# Patient Record
Sex: Female | Born: 2000 | Race: White | Hispanic: No | Marital: Single | Smoking: Never smoker
Health system: Southern US, Community
[De-identification: ages and names within clinical notes are randomized; demographics above are authoritative.]

## PROBLEM LIST (undated history)

## (undated) DIAGNOSIS — K5900 Constipation, unspecified: Secondary | ICD-10-CM

## (undated) DIAGNOSIS — L309 Dermatitis, unspecified: Secondary | ICD-10-CM

## (undated) HISTORY — PX: WISDOM TOOTH EXTRACTION: SHX21

## (undated) HISTORY — PX: LAPAROSCOPIC OOPHERECTOMY: SHX6507

---
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
## 2018-11-23 ENCOUNTER — Emergency Department (HOSPITAL_BASED_OUTPATIENT_CLINIC_OR_DEPARTMENT_OTHER): Payer: BC Managed Care – PPO

## 2018-11-23 ENCOUNTER — Encounter (HOSPITAL_BASED_OUTPATIENT_CLINIC_OR_DEPARTMENT_OTHER): Payer: Self-pay | Admitting: Emergency Medicine

## 2018-11-23 ENCOUNTER — Other Ambulatory Visit: Payer: Self-pay

## 2018-11-23 ENCOUNTER — Emergency Department (HOSPITAL_BASED_OUTPATIENT_CLINIC_OR_DEPARTMENT_OTHER)
Admission: EM | Admit: 2018-11-23 | Discharge: 2018-11-23 | Disposition: A | Discharge status: Home / Self Care | Payer: BC Managed Care – PPO | Attending: Emergency Medicine | Admitting: Emergency Medicine

## 2018-11-23 DIAGNOSIS — S93492A Sprain of other ligament of left ankle, initial encounter: Secondary | ICD-10-CM | POA: Insufficient documentation

## 2018-11-23 DIAGNOSIS — Y999 Unspecified external cause status: Secondary | ICD-10-CM | POA: Insufficient documentation

## 2018-11-23 DIAGNOSIS — W108XXA Fall (on) (from) other stairs and steps, initial encounter: Secondary | ICD-10-CM | POA: Diagnosis not present

## 2018-11-23 DIAGNOSIS — Y9301 Activity, walking, marching and hiking: Secondary | ICD-10-CM | POA: Insufficient documentation

## 2018-11-23 DIAGNOSIS — S99912A Unspecified injury of left ankle, initial encounter: Secondary | ICD-10-CM | POA: Diagnosis present

## 2018-11-23 DIAGNOSIS — Y929 Unspecified place or not applicable: Secondary | ICD-10-CM | POA: Diagnosis not present

## 2018-11-23 MED ORDER — IBUPROFEN 400 MG PO TABS
400.0000 mg | ORAL_TABLET | Freq: Once | ORAL | Status: AC
  Administered 2018-11-23: 400 mg via ORAL
  Filled 2018-11-23: qty 1

## 2018-11-23 MED ORDER — ACETAMINOPHEN 500 MG PO TABS
1000.0000 mg | ORAL_TABLET | Freq: Once | ORAL | Status: AC
  Administered 2018-11-23: 1000 mg via ORAL
  Filled 2018-11-23: qty 2

## 2018-11-23 NOTE — ED Provider Notes (Signed)
[facility] EMERGENCY DEPARTMENT Provider Note   CSN: [id number] Arrival date & time: 11/23/18  1728     History   Chief Complaint Chief Complaint  Patient presents with  . Ankle Pain    HPI Renee NielsenVictoria Foley is a 18 y.o. female.     HPI   18 year old female presents with concern for left ankle pain after a fall.  Reports that she fell down 1 stair, with immediate pain to her left ankle.  Reports she has not been able to walk on it since this happened.  Denies any other injuries from the fall, including no knee pain, no neck pain, headache, or other injuries.  Denies numbness or weakness.  Past Medical History:  Diagnosis Date  . Constipated     There are no active problems to display for this patient.   Past Surgical History:  Procedure Laterality Date  . LAPAROSCOPIC OOPHERECTOMY       OB History   No obstetric history on file.      Home Medications    Prior to Admission medications   Medication Sig Start Date End Date Taking? Authorizing Provider  drospirenone-ethinyl estradiol (YAZ) 3-0.02 MG tablet Take 1 tablet by mouth daily.   Yes [provider]  EPINEPHrine 0.3 mg/0.3 mL IJ SOAJ injection Inject 0.3 mLs (0.3 mg total) into the muscle once. 02/01/14   Rolland PorterJames, Mark, MD    Family History No family history on file.  Social History Social History   Tobacco Use  . Smoking status: Never Smoker  . Smokeless tobacco: Never Used  Substance Use Topics  . Alcohol use: No  . Drug use: Not on file     Allergies   Amoxil [amoxicillin]   Review of Systems Review of Systems  Constitutional: Negative for fever.  Respiratory: Negative for cough.   Cardiovascular: Negative for chest pain.  Gastrointestinal: Negative for abdominal pain.  Musculoskeletal: Positive for arthralgias. Negative for back pain and neck pain.  Skin: Negative for rash.  Neurological: Negative for syncope, weakness, numbness and headaches.     Physical Exam  Updated Vital Signs BP (!) 128/91 (BP Location: Right Arm)   Pulse (!) 109   Temp 98.2 F (36.8 C) (Oral)   Resp 18   Ht 5' (1.524 m)   Wt 68 kg   LMP 11/23/2018   SpO2 100%   BMI 29.29 kg/m   Physical Exam Vitals signs and nursing note reviewed.  Constitutional:      General: She is not in acute distress.    Appearance: She is well-developed. She is not diaphoretic.  HENT:     Head: Normocephalic and atraumatic.  Eyes:     Conjunctiva/sclera: Conjunctivae normal.  Neck:     Musculoskeletal: Normal range of motion.  Cardiovascular:     Rate and Rhythm: Normal rate and regular rhythm.  Pulmonary:     Effort: Pulmonary effort is normal. No respiratory distress.  Musculoskeletal:     Left ankle: She exhibits decreased range of motion (mild with pain) and swelling (mild). She exhibits no deformity, no laceration and normal pulse. Tenderness. Lateral malleolus, AITFL and head of 5th metatarsal tenderness found. No medial malleolus and no proximal fibula tenderness found.  Skin:    General: Skin is warm and dry.     Findings: No erythema or rash.  Neurological:     Mental Status: She is alert and oriented to person, place, and time.      ED Treatments / Results  Labs (all labs ordered are listed, but only abnormal results are displayed) Labs Reviewed - No data to display  EKG None  Radiology Dg Ankle Complete Left  Result Date: 11/23/2018 CLINICAL DATA:  Left foot and ankle pain and swelling following a fall. EXAM: LEFT ANKLE COMPLETE - 3+ VIEW COMPARISON:  Left foot radiographs obtained at the same time. FINDINGS: Normal appearing bones and soft tissues without fracture, dislocation or effusion. IMPRESSION: Normal examination. Electronically Signed   By: Claudie Revering M.D.   On: 11/23/2018 17:59   Dg Foot Complete Left  Result Date: 11/23/2018 CLINICAL DATA:  Left foot and ankle pain and swelling following a fall today. EXAM: LEFT FOOT - COMPLETE 3+ VIEW COMPARISON:   Left ankle radiographs obtained at the same time. FINDINGS: Normal appearing bones and soft tissues without fracture or dislocation. IMPRESSION: Normal examination. Electronically Signed   By: Claudie Revering M.D.   On: 11/23/2018 18:00    Procedures Procedures (including critical care time)  Medications Ordered in ED Medications  acetaminophen (TYLENOL) tablet 1,000 mg (1,000 mg Oral Given 11/23/18 1754)  ibuprofen (ADVIL) tablet 400 mg (400 mg Oral Given 11/23/18 1754)     Initial Impression / Assessment and Plan / ED Course  I have reviewed the triage vital signs and the nursing notes.  Pertinent labs & imaging results that were available during my care of the patient were reviewed by me and considered in my medical decision making (see chart for details).         18 year old female presents with concern for left ankle pain after a fall.  Denies other injuries. NV intact. XR of foot and ankle without sign of fracture.  Suspect likely anterior talofibular ligament ankle sprain.  Recommend ice, elevation, ibuprofen and Tylenol, and weightbearing as tolerated.  Given ankle splint and crutches.  Final Clinical Impressions(s) / ED Diagnoses   Final diagnoses:  Sprain of anterior talofibular ligament of left ankle, initial encounter    ED Discharge Orders    None       Gareth Morgan, MD 11/23/18 1836

## 2018-11-23 NOTE — ED Triage Notes (Signed)
L ankle injury today after falling down 1 stair.

## 2019-07-18 ENCOUNTER — Ambulatory Visit: Payer: BC Managed Care – PPO | Attending: Internal Medicine

## 2019-07-18 DIAGNOSIS — Z23 Encounter for immunization: Secondary | ICD-10-CM

## 2019-07-18 NOTE — Progress Notes (Signed)
   Covid-19 Vaccination Clinic  Name:  Renee Foley    MRN: [id number] DOB: 16-Aug-2000  07/18/2019  [name] was observed post Covid-19 immunization for 15 minutes without incident. She was provided with Vaccine Information Sheet and instruction to access the V-Safe system.   [name] was instructed to call 911 with any severe reactions post vaccine: Marland Kitchen Difficulty breathing  . Swelling of face and throat  . A fast heartbeat  . A bad rash all over body  . Dizziness and weakness   Immunizations Administered    Name Date Dose VIS Date Route   Pfizer COVID-19 Vaccine 07/18/2019  4:04 PM 0.3 mL 04/18/2019 Intramuscular   Manufacturer: [employer], [employer]   Lot: [id number]   NDC: [address]-4

## 2019-08-11 ENCOUNTER — Ambulatory Visit: Payer: BC Managed Care – PPO | Attending: Internal Medicine

## 2019-08-11 DIAGNOSIS — Z23 Encounter for immunization: Secondary | ICD-10-CM

## 2019-08-11 NOTE — Progress Notes (Signed)
   Covid-19 Vaccination Clinic  Name:  Renee Foley    MRN: [id number] DOB: October 29, 2000  08/11/2019  [name] was observed post Covid-19 immunization for 15 minutes without incident. She was provided with Vaccine Information Sheet and instruction to access the V-Safe system.   [name] was instructed to call 911 with any severe reactions post vaccine: Marland Kitchen Difficulty breathing  . Swelling of face and throat  . A fast heartbeat  . A bad rash all over body  . Dizziness and weakness   Immunizations Administered    Name Date Dose VIS Date Route   Pfizer COVID-19 Vaccine 08/11/2019  4:58 PM 0.3 mL 04/18/2019 Intramuscular   Manufacturer: [employer], [employer]   Lot: [id number]   NDC: [address]-2

## 2019-12-11 IMAGING — DX LEFT FOOT - COMPLETE 3+ VIEW
3 series · 3 of 3 positions shown · non-contrast
Comparison: Left ankle radiographs obtained at the same time.

CLINICAL DATA: Left foot and ankle pain and swelling following a
fall today.

EXAM:
LEFT FOOT - COMPLETE 3+ VIEW

[foot ap]
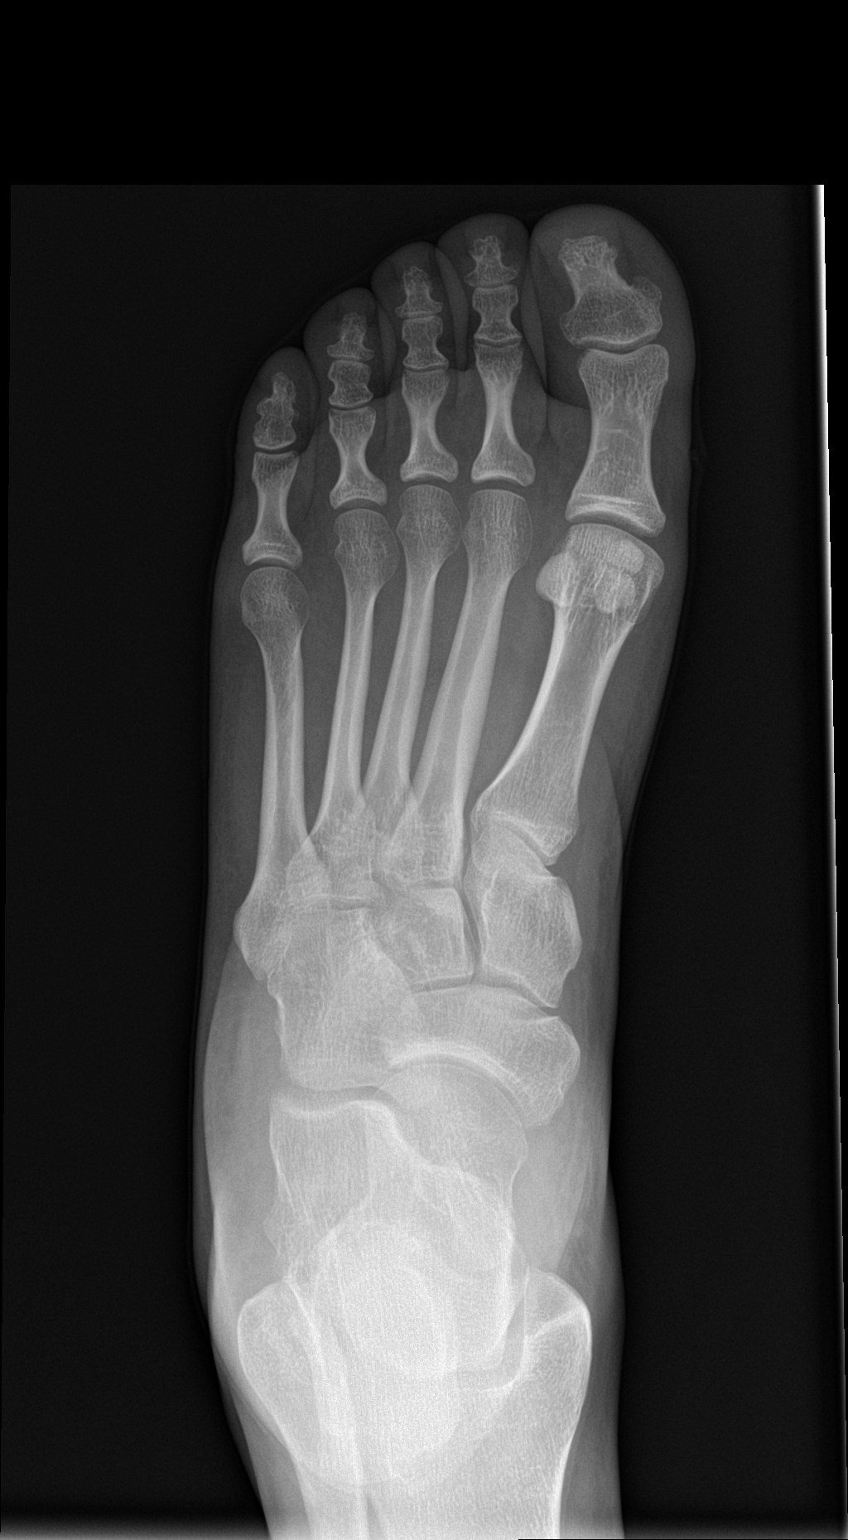

[foot obl]
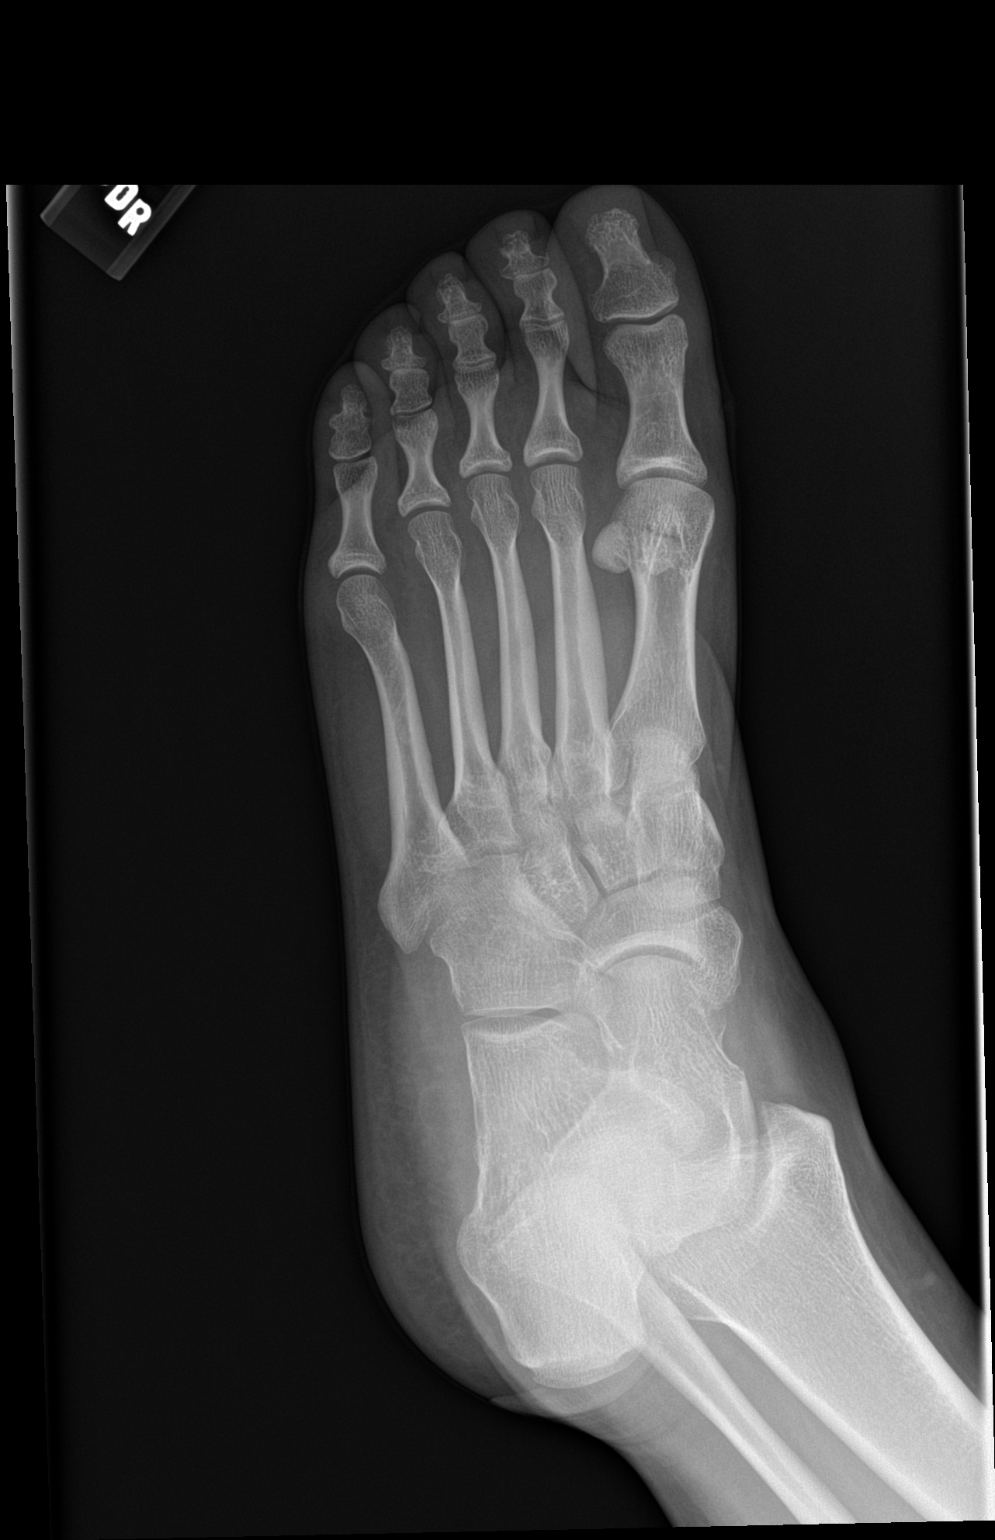

[foot lat]
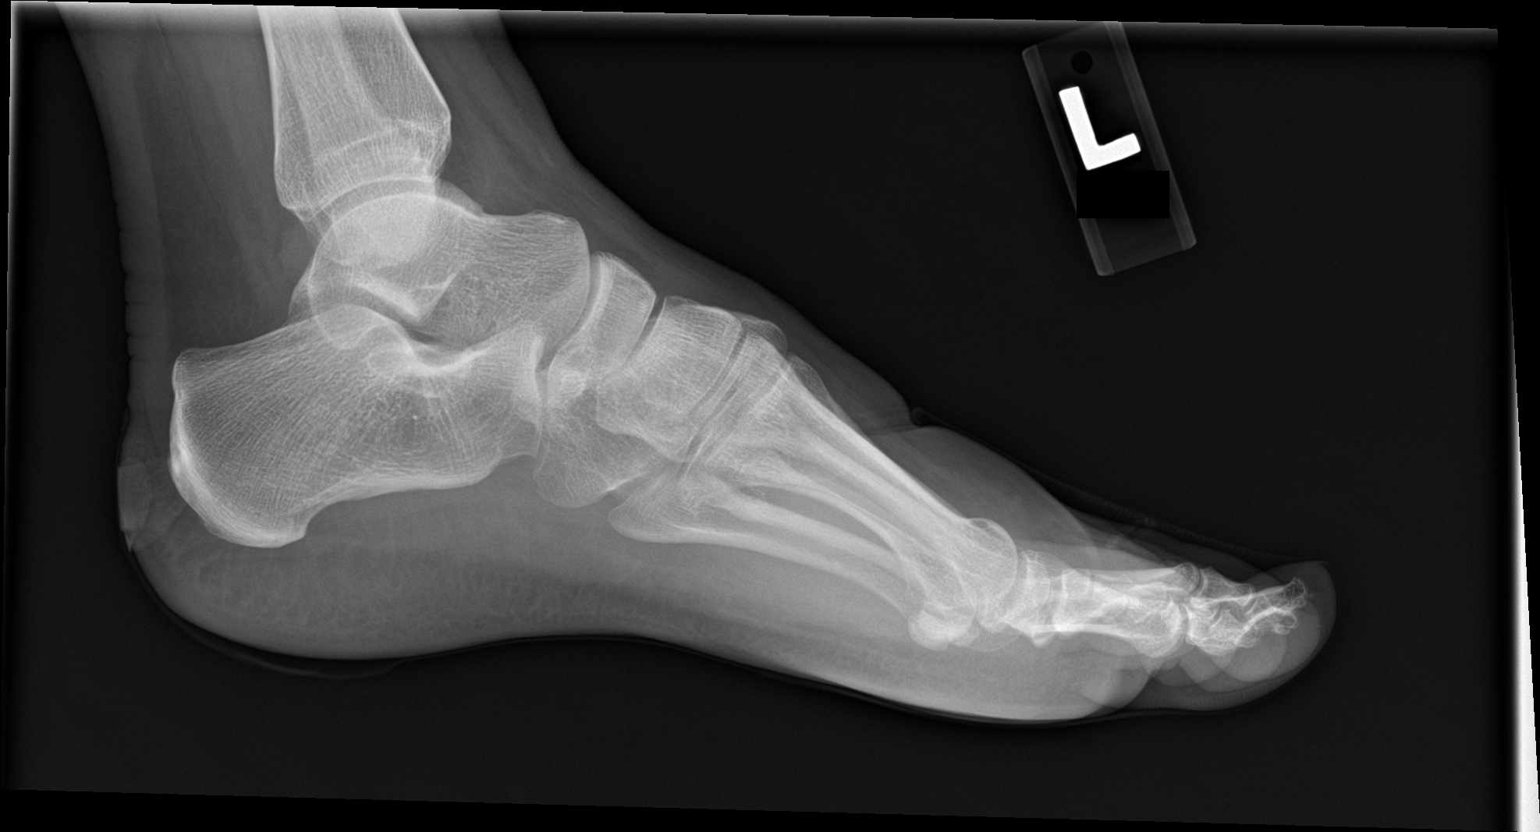

[3 of 3 positions shown; findings below may reference images not displayed]

FINDINGS: Normal appearing bones and soft tissues without fracture or
dislocation.
IMPRESSION: Normal examination.

## 2023-02-27 ENCOUNTER — Emergency Department (HOSPITAL_BASED_OUTPATIENT_CLINIC_OR_DEPARTMENT_OTHER): Payer: BC Managed Care – PPO

## 2023-02-27 ENCOUNTER — Emergency Department (HOSPITAL_BASED_OUTPATIENT_CLINIC_OR_DEPARTMENT_OTHER)
Admission: EM | Admit: 2023-02-27 | Discharge: 2023-02-27 | Disposition: A | Discharge status: Home / Self Care | Payer: BC Managed Care – PPO | Attending: Emergency Medicine | Admitting: Emergency Medicine

## 2023-02-27 ENCOUNTER — Encounter (HOSPITAL_BASED_OUTPATIENT_CLINIC_OR_DEPARTMENT_OTHER): Payer: Self-pay | Admitting: Emergency Medicine

## 2023-02-27 ENCOUNTER — Other Ambulatory Visit: Payer: Self-pay

## 2023-02-27 DIAGNOSIS — S59902A Unspecified injury of left elbow, initial encounter: Secondary | ICD-10-CM | POA: Diagnosis present

## 2023-02-27 DIAGNOSIS — Y990 Civilian activity done for income or pay: Secondary | ICD-10-CM | POA: Diagnosis not present

## 2023-02-27 DIAGNOSIS — S5002XA Contusion of left elbow, initial encounter: Secondary | ICD-10-CM | POA: Diagnosis not present

## 2023-02-27 DIAGNOSIS — S0990XA Unspecified injury of head, initial encounter: Secondary | ICD-10-CM | POA: Diagnosis not present

## 2023-02-27 DIAGNOSIS — W01198A Fall on same level from slipping, tripping and stumbling with subsequent striking against other object, initial encounter: Secondary | ICD-10-CM | POA: Insufficient documentation

## 2023-02-27 HISTORY — DX: Dermatitis, unspecified: L30.9

## 2023-02-27 NOTE — ED Triage Notes (Signed)
Patient reports falling at work x 1 hour pta. Patient slipped and fell. Patient unsure if she hit her head. No loc. C/o left elbow pain and mild headache.

## 2023-02-27 NOTE — Discharge Instructions (Addendum)
Based on the events which brought you to the ER today, it is possible that you may have a concussion. A concussion occurs when there is a blow to the head or body, with enough force to shake the brain and disrupt how the brain functions. You may experience symptoms such as headaches, sensitivity to light/noise, dizziness, cognitive slowing, difficulty concentrating / remembering, trouble sleeping and drowsiness. These symptoms may last anywhere from hours/days to potentially weeks/months. While these symptoms are very frustrating and perhaps debilitating, it is important that you remember that they will improve over time. Everyone has a different rate of recovery; it is difficult to predict when your symptoms will resolve. In order to allow for your brain to heal after the injury, we recommend that you see your primary physician or a physician knowledgeable in concussion management. We also advise you to let your body and brain rest: avoid physical activities (sports, gym, and exercise) and reduce cognitive demands (reading, texting, TV watching, computer use, video games, etc). School attendance, after-school activities and work may need to be modified to avoid increasing symptoms. We recommend against driving until until all symptoms have resolved. Come back to the ER right away if you are having repeated episodes of vomiting, severe/worsening headache/dizziness or any other symptom that alarms you. We recommended that someone stay with you for the next 24 hours to monitor for these worrisome symptoms.  

## 2023-02-27 NOTE — ED Provider Notes (Signed)
[place] [facility] DEPARTMENT AT [facility] Provider Note  CSN: [id number] Arrival date & time: 02/27/23 2105  Chief Complaint(s) Elbow Injury  HPI Andie Mckamey is a 22 y.o. female with past medical history as below, significant for constipation, eczema who presents to the ED with complaint of fall, elbow injury  Patient slipped on a wet floor at work approximate 7 PM this evening, fell onto her left side, hit her left elbow and left hip.  Unsure of head injury.  She was able to ambulate after the fall.  No LOC.  No numbness or weakness to extremities.  Reports pain to left elbow and mild headache.  Headache is since resolved since the initial onset.  No nausea or vomiting.  No vision or hearing changes.  She is LHD, no thinners  Past Medical History Past Medical History:  Diagnosis Date   Constipated    Eczema    There are no problems to display for this patient.  Home Medication(s) Prior to Admission medications   Medication Sig Start Date End Date Taking? Authorizing Provider  drospirenone-ethinyl estradiol (YAZ) 3-0.02 MG tablet Take 1 tablet by mouth daily.    [provider]  EPINEPHrine 0.3 mg/0.3 mL IJ SOAJ injection Inject 0.3 mLs (0.3 mg total) into the muscle once. 02/01/14   Rolland Porter, MD                                                                                                                                    Past Surgical History Past Surgical History:  Procedure Laterality Date   LAPAROSCOPIC OOPHERECTOMY     WISDOM TOOTH EXTRACTION     Family History History reviewed. No pertinent family history.  Social History Social History   Tobacco Use   Smoking status: Never   Smokeless tobacco: Never  Vaping Use   Vaping status: Never Used  Substance Use Topics   Alcohol use: No   Drug use: Never   Allergies Penicillins and Amoxil [amoxicillin]  Review of Systems Review of Systems  Constitutional:  Negative for fever.   Respiratory:  Negative for chest tightness and shortness of breath.   Cardiovascular:  Negative for chest pain and palpitations.  Gastrointestinal:  Negative for abdominal pain, nausea and vomiting.  Genitourinary:  Negative for dysuria.  Musculoskeletal:  Positive for arthralgias. Negative for back pain, neck pain and neck stiffness.  Skin:  Negative for wound.  Neurological:  Positive for headaches. Negative for syncope, speech difficulty and numbness.  All other systems reviewed and are negative.   Physical Exam Vital Signs  I have reviewed the triage vital signs BP 118/89 (BP Location: Right Arm)   Pulse 91   Temp 98.7 F (37.1 C)   Resp 18   Ht 5\' 1"  (1.549 m)   Wt 74.8 kg   LMP 02/06/2023 (Approximate)   SpO2 98%   BMI 31.18 kg/m  Physical Exam Vitals  and nursing note reviewed.  Constitutional:      General: She is not in acute distress.    Appearance: Normal appearance.  HENT:     Head: Normocephalic and atraumatic. No raccoon eyes, Battle's sign, right periorbital erythema or left periorbital erythema.     Jaw: There is normal jaw occlusion. No trismus.     Comments: No drooling trismus stridor, no external signs of head trauma    Right Ear: External ear normal.     Left Ear: External ear normal.     Nose: Nose normal.     Mouth/Throat:     Mouth: Mucous membranes are moist.  Eyes:     General: No scleral icterus.       Right eye: No discharge.        Left eye: No discharge.     Extraocular Movements: Extraocular movements intact.     Pupils: Pupils are equal, round, and reactive to light.  Neck:     Trachea: Trachea and phonation normal.  Cardiovascular:     Rate and Rhythm: Normal rate and regular rhythm.     Pulses: Normal pulses.     Heart sounds: Normal heart sounds.  Pulmonary:     Effort: Pulmonary effort is normal. No respiratory distress.     Breath sounds: Normal breath sounds. No stridor.  Abdominal:     General: Abdomen is flat. There is no  distension.     Palpations: Abdomen is soft.     Tenderness: There is no abdominal tenderness.  Musculoskeletal:       Arms:     Cervical back: Full passive range of motion without pain and normal range of motion. No rigidity. No pain with movement or spinous process tenderness.     Right lower leg: No edema.     Left lower leg: No edema.       Legs:     Comments: Full range of motion lower extremities, no weakness lower extremities, gait steady  Skin:    General: Skin is warm and dry.     Capillary Refill: Capillary refill takes less than 2 seconds.  Neurological:     Mental Status: She is alert and oriented to person, place, and time.     GCS: GCS eye subscore is 4. GCS verbal subscore is 5. GCS motor subscore is 6.     Cranial Nerves: Cranial nerves 2-12 are intact. No dysarthria.     Sensory: Sensation is intact.     Motor: Motor function is intact. No tremor.     Coordination: Coordination is intact.     Comments: Strength 5/5 bilateral upper and lower extremities  Upper extremities NVI  Psychiatric:        Mood and Affect: Mood normal.        Behavior: Behavior normal. Behavior is cooperative.     ED Results and Treatments Labs (all labs ordered are listed, but only abnormal results are displayed) Labs Reviewed - No data to display  Radiology DG Elbow Complete Left  Result Date: 02/27/2023 CLINICAL DATA:  Left elbow injury, slipped on floor. EXAM: LEFT ELBOW - COMPLETE 3+ VIEW COMPARISON:  None Available. FINDINGS: There is no evidence of fracture, dislocation, or joint effusion. The alignment joint spaces are normal there is no evidence of arthropathy or other focal bone abnormality. Question of mild soft tissue edema posteriorly. IMPRESSION: Question of mild soft tissue edema posteriorly. No fracture or subluxation of the left elbow. Electronically  Signed   By: Narda Rutherford M.D.   On: 02/27/2023 23:15    Pertinent labs & imaging results that were available during my care of the patient were reviewed by me and considered in my medical decision making (see MDM for details).  Medications Ordered in ED Medications - No data to display                                                                                                                                   Procedures Procedures  (including critical care time)  Medical Decision Making / ED Course    Medical Decision Making:    Jamicia Hsieh is a 22 y.o. female with past medical history as below, significant for constipation, eczema who presents to the ED with complaint of fall, elbow injury. The complaint involves an extensive differential diagnosis and also carries with it a high risk of complications and morbidity.  Serious etiology was considered. Ddx includes but is not limited to: Sprain, strain, soft tissue injury, contusion, concussion, MSK injury, etc.  Differential diagnoses for head trauma includes subdural hematoma, epidural hematoma, acute concussion, traumatic subarachnoid hemorrhage, cerebral contusions, etc.   Complete initial physical exam performed, notably the patient  was no acute distress, sitting upright, neuroexam is nonfocal.    Reviewed and confirmed nursing documentation for past medical history, family history, social history.  Vital signs reviewed.        Patient with ground-level fall.  Left elbow injury.  Unsure of head injury but did have headache following the fall.  Nexus CT head instrument negative.  I very low suspicion for acute intracranial injury.  Possible concussion.  Given concussion precautions.  She is approximately 6 hours status post fall.  Symptoms have improved since the onset, initial headache is improved  X-ray reviewed, apparent contusion to left elbow.  Placed in sling.  Recommend NSAIDs, RICE, no heavy lifting,  follow-up PCP  Concussion precautions provided to patient and mother  The patient improved significantly and was discharged in stable condition. Detailed discussions were had with the patient regarding current findings, and need for close f/u with PCP or on call doctor. The patient has been instructed to return immediately if the symptoms worsen in any way for re-evaluation. Patient verbalized understanding and is in agreement with current care plan. All questions answered prior to discharge.  Additional history obtained: -Additional history obtained from mother -External records from outside source obtained and reviewed including: Chart review including previous notes, labs, imaging, consultation notes including  Primary care recommendation, home medications-no thinners   Lab Tests: na  EKG   EKG Interpretation Date/Time:    Ventricular Rate:    PR Interval:    QRS Duration:    QT Interval:    QTC Calculation:   R Axis:      Text Interpretation:           Imaging Studies ordered: I ordered imaging studies including elbow xr I independently visualized the following imaging with scope of interpretation limited to determining acute life threatening conditions related to emergency care; findings noted above I independently visualized and interpreted imaging. I agree with the radiologist interpretation   Medicines ordered and prescription drug management: No orders of the defined types were placed in this encounter.   -I have reviewed the patients home medicines and have made adjustments as needed   Consultations Obtained: na   Cardiac Monitoring: Continuous pulse oximetry interpreted by myself, 99% on RA.    Social Determinants of Health:  Diagnosis or treatment significantly limited by social determinants of health: live at home   Reevaluation: After the interventions noted above, I reevaluated the patient and found that they  have improved  Co morbidities that complicate the patient evaluation  Past Medical History:  Diagnosis Date   Constipated    Eczema       Dispostion: Disposition decision including need for hospitalization was considered, and patient discharged from emergency department.    Final Clinical Impression(s) / ED Diagnoses Final diagnoses:  Contusion of left elbow, initial encounter  Minor head injury, initial encounter        Sloan Leiter, DO 02/27/23 2349
